# Patient Record
Sex: Female | Born: 1980 | Hispanic: No | Marital: Married | Smoking: Former smoker
Health system: Southern US, Community
[De-identification: ages and names within clinical notes are randomized; demographics above are authoritative.]

## PROBLEM LIST (undated history)

## (undated) ENCOUNTER — Inpatient Hospital Stay (HOSPITAL_COMMUNITY): Payer: Self-pay

## (undated) DIAGNOSIS — Z789 Other specified health status: Secondary | ICD-10-CM

## (undated) DIAGNOSIS — Z8619 Personal history of other infectious and parasitic diseases: Secondary | ICD-10-CM

## (undated) DIAGNOSIS — IMO0002 Reserved for concepts with insufficient information to code with codable children: Secondary | ICD-10-CM

## (undated) DIAGNOSIS — R51 Headache: Secondary | ICD-10-CM

## (undated) DIAGNOSIS — R87619 Unspecified abnormal cytological findings in specimens from cervix uteri: Secondary | ICD-10-CM

## (undated) DIAGNOSIS — O24419 Gestational diabetes mellitus in pregnancy, unspecified control: Secondary | ICD-10-CM

## (undated) HISTORY — DX: Headache: R51

## (undated) HISTORY — PX: ABDOMINAL HYSTERECTOMY: SHX81

## (undated) HISTORY — DX: Personal history of other infectious and parasitic diseases: Z86.19

## (undated) HISTORY — DX: Reserved for concepts with insufficient information to code with codable children: IMO0002

## (undated) HISTORY — PX: NO PAST SURGERIES: SHX2092

## (undated) HISTORY — DX: Unspecified abnormal cytological findings in specimens from cervix uteri: R87.619

---

## 2011-07-04 LAB — OB RESULTS CONSOLE GC/CHLAMYDIA: Chlamydia: NEGATIVE

## 2011-07-04 LAB — OB RESULTS CONSOLE ABO/RH: RH Type: POSITIVE

## 2011-07-04 LAB — OB RESULTS CONSOLE HEPATITIS B SURFACE ANTIGEN: Hepatitis B Surface Ag: NEGATIVE

## 2011-07-04 LAB — OB RESULTS CONSOLE HIV ANTIBODY (ROUTINE TESTING): HIV: NONREACTIVE

## 2011-07-04 LAB — OB RESULTS CONSOLE RPR: RPR: NONREACTIVE

## 2011-10-17 NOTE — L&D Delivery Note (Addendum)
Delivery Note At 3:35 AM a viable and healthy female was delivered via Vaginal, Vacuum assist (Presentation: Left Occiput Anterior).  APGAR: 8,9 ; weight 6 lbs 9 oz .   Placenta status: Intact, Spontaneous.  Cord: 3 vessels.  Anesthesia: Epidural  Lacerations: 2nd degree  Suture Repair: 3.0 chromic Est. Blood Loss (mL): 400  Mom to postpartum.  Baby to nursery-stable.  Kristie Clark D 02/10/2012, 4:07 AM

## 2011-12-13 ENCOUNTER — Encounter: Payer: BC Managed Care – PPO | Attending: Obstetrics and Gynecology | Admitting: *Deleted

## 2011-12-13 DIAGNOSIS — O99810 Abnormal glucose complicating pregnancy: Secondary | ICD-10-CM | POA: Insufficient documentation

## 2011-12-13 DIAGNOSIS — Z713 Dietary counseling and surveillance: Secondary | ICD-10-CM | POA: Insufficient documentation

## 2011-12-14 ENCOUNTER — Encounter: Payer: Self-pay | Admitting: *Deleted

## 2011-12-14 NOTE — Patient Instructions (Signed)
Goals:  Check glucose levels per MD as instructed  Follow Gestational Diabetes Diet as instructed  Call for follow-up as needed    

## 2011-12-14 NOTE — Progress Notes (Signed)
  Patient was seen on 12/13/2011 for Gestational Diabetes self-management class at the [facility]. The following learning objectives were met by the patient during this course:   States the definition of Gestational Diabetes  States why dietary management is important in controlling blood glucose  Describes the effects each nutrient has on blood glucose levels  Demonstrates ability to create a balanced meal plan  Demonstrates carbohydrate counting   States when to check blood glucose levels  Demonstrates proper blood glucose monitoring techniques  States the effect of stress and exercise on blood glucose levels  States the importance of limiting caffeine and abstaining from alcohol and smoking  Blood glucose monitor given:  One Touch Ultra Mini Self Monitoring Kit Lot # [id number] x Exp: 08/2012 Blood glucose reading: 121 mg/dl  Patient instructed to monitor glucose levels: FBS: 60 - <90 2 hour: <120  *Patient received handouts:  Nutrition Diabetes and Pregnancy  Carbohydrate Counting List  Patient will be seen for follow-up as needed.

## 2012-01-08 ENCOUNTER — Encounter (HOSPITAL_COMMUNITY): Payer: Self-pay | Admitting: *Deleted

## 2012-01-08 ENCOUNTER — Inpatient Hospital Stay (HOSPITAL_COMMUNITY)
Admission: AD | Admit: 2012-01-08 | Discharge: 2012-01-08 | Disposition: A | Discharge status: Home / Self Care | Payer: BC Managed Care – PPO | Source: Ambulatory Visit | Attending: Obstetrics & Gynecology | Admitting: Obstetrics & Gynecology

## 2012-01-08 DIAGNOSIS — O479 False labor, unspecified: Secondary | ICD-10-CM

## 2012-01-08 DIAGNOSIS — R109 Unspecified abdominal pain: Secondary | ICD-10-CM | POA: Insufficient documentation

## 2012-01-08 DIAGNOSIS — O4700 False labor before 37 completed weeks of gestation, unspecified trimester: Secondary | ICD-10-CM | POA: Insufficient documentation

## 2012-01-08 HISTORY — DX: Gestational diabetes mellitus in pregnancy, unspecified control: O24.419

## 2012-01-08 LAB — URINALYSIS, ROUTINE W REFLEX MICROSCOPIC
Glucose, UA: NEGATIVE mg/dL
Protein, ur: NEGATIVE mg/dL
Urobilinogen, UA: 0.2 mg/dL (ref 0.0–1.0)

## 2012-01-08 LAB — URINE MICROSCOPIC-ADD ON

## 2012-01-08 MED ORDER — SODIUM CHLORIDE 0.9 % IV SOLN
25.0000 mg | Freq: Once | INTRAVENOUS | Status: AC
  Administered 2012-01-08: 25 mg via INTRAVENOUS
  Filled 2012-01-08: qty 1

## 2012-01-08 MED ORDER — NIFEDIPINE 10 MG PO CAPS
10.0000 mg | ORAL_CAPSULE | Freq: Once | ORAL | Status: AC
  Administered 2012-01-08: 10 mg via ORAL
  Filled 2012-01-08: qty 1

## 2012-01-08 NOTE — MAU Provider Note (Signed)
  History     CSN: [id number]  Arrival date and time: 01/08/12 6295   First Provider Initiated Contact with Patient 01/08/12 [phone]      Chief Complaint  Patient presents with  . Abdominal Cramping   HPI This is a 31 y.o. at [redacted]w[redacted]d who presents with c/o contractions and nausea all night. No leaking or bleeding. Was feeling decreased fetal movement but baby is moving well now. Pregnancy has been remarkable for Gestational Diabetes and smoking.  She told RN she is not taking her Glyburide because she did some research and believes it will hurt the baby.   OB History    Grav Para Term Preterm Abortions TAB SAB Ect Mult Living   1               Past Medical History  Diagnosis Date  . Gestational diabetes     Past Surgical History  Procedure Date  . No past surgeries     History reviewed. No pertinent family history.  History  Substance Use Topics  . Smoking status: Former Games developer  . Smokeless tobacco: Not on file  . Alcohol Use: No    Allergies: No Known Allergies  Prescriptions prior to admission  Medication Sig Dispense Refill  . calcium carbonate (TUMS - DOSED IN MG ELEMENTAL CALCIUM) 500 MG chewable tablet Chew 1 tablet by mouth daily as needed. For heartburn      . guaiFENesin (MUCINEX) 600 MG 12 hr tablet Take 600 mg by mouth 2 (two) times daily. For congestion or cold symptoms      . Prenatal Vit-Fe Fumarate-FA (PRENATAL MULTIVITAMIN) TABS Take 1 tablet by mouth every morning.      . ranitidine (ZANTAC) 150 MG tablet Take 150 mg by mouth 2 (two) times daily as needed. For heartburn        Review of Systems  Constitutional: Negative for fever.  Gastrointestinal: Positive for nausea. Negative for vomiting.       Contractions    Physical Exam   Blood pressure 124/81, pulse 91, temperature 97.1 F (36.2 C), resp. rate 18, height 5' (1.524 m), weight 170 lb (77.111 kg).  Physical Exam  Constitutional: She is oriented to person, place, and time. She appears  well-developed and well-nourished. No distress.  HENT:  Head: Normocephalic.  Cardiovascular: Normal rate.   Respiratory: Effort normal.  GI: Soft. She exhibits no distension and no mass. There is no tenderness. There is no rebound and no guarding.  Genitourinary: Vagina normal and uterus normal. No vaginal discharge found.       Cervix closed/70%/-3/vtx FHR reactive UCs every 3-4 minutes  Musculoskeletal: Normal range of motion.  Neurological: She is alert and oriented to person, place, and time.  Skin: Skin is warm and dry.  Psychiatric: She has a normal mood and affect.    MAU Course  Procedures  MDM Consulted Dr Aldona Bar.  Will hydrate with IV fluids and give Phenergan for nausea. Will give Procardia for contractions.  Assessment and Plan  Got fluids and med and UCs have stopped. Now just has some irritability.  Will d/c home. Will reinforce importance of taking glyburide  [employer] [facility] 01/08/2012, 5:36 AM

## 2012-01-08 NOTE — Discharge Instructions (Signed)
Normal Labor and Delivery Your caregiver must first be sure you are in labor. Signs of labor include:  You may pass what is called "the mucus plug" before labor begins. This is a small amount of blood stained mucus.   Regular uterine contractions.   The time between contractions get closer together.   The discomfort and pain gradually gets more intense.   Pains are mostly located in the back.   Pains get worse when walking.   The cervix (the opening of the uterus becomes thinner (begins to efface) and opens up (dilates).  Once you are in labor and admitted into the hospital or care center, your caregiver will do the following:  A complete physical examination.   Check your vital signs (blood pressure, pulse, temperature and the fetal heart rate).   Do a vaginal examination (using a sterile glove and lubricant) to determine:   The position (presentation) of the baby (head [vertex] or buttock first).   The level (station) of the baby's head in the birth canal.   The effacement and dilatation of the cervix.   You may have your pubic hair shaved and be given an enema depending on your caregiver and the circumstance.   An electronic monitor is usually placed on your abdomen. The monitor follows the length and intensity of the contractions, as well as the baby's heart rate.   Usually, your caregiver will insert an IV in your arm with a bottle of sugar water. This is done as a precaution so that medications can be given to you quickly during labor or delivery.  NORMAL LABOR AND DELIVERY IS DIVIDED UP INTO 3 STAGES: First Stage This is when regular contractions begin and the cervix begins to efface and dilate. This stage can last from 3 to 15 hours. The end of the first stage is when the cervix is 100% effaced and 10 centimeters dilated. Pain medications may be given by   Injection (morphine, demerol, etc.)   Regional anesthesia (spinal, caudal or epidural, anesthetics given in  different locations of the spine). Paracervical pain medication may be given, which is an injection of and anesthetic on each side of the cervix.  A pregnant woman may request to have "Natural Childbirth" which is not to have any medications or anesthesia during her labor and delivery. Second Stage This is when the baby comes down through the birth canal (vagina) and is born. This can take 1 to 4 hours. As the baby's head comes down through the birth canal, you may feel like you are going to have a bowel movement. You will get the urge to bear down and push until the baby is delivered. As the baby's head is being delivered, the caregiver will decide if an episiotomy (a cut in the perineum and vagina area) is needed to prevent tearing of the tissue in this area. The episiotomy is sewn up after the delivery of the baby and placenta. Sometimes a mask with nitrous oxide is given for the mother to breath during the delivery of the baby to help if there is too much pain. The end of Stage 2 is when the baby is fully delivered. Then when the umbilical cord stops pulsating it is clamped and cut. Third Stage The third stage begins after the baby is completely delivered and ends after the placenta (afterbirth) is delivered. This usually takes 5 to 30 minutes. After the placenta is delivered, a medication is given either by intravenous or injection to help contract   the uterus and prevent bleeding. The third stage is not painful and pain medication is usually not necessary. If an episiotomy was done, it is repaired at this time. After the delivery, the mother is watched and monitored closely for 1 to 2 hours to make sure there is no postpartum bleeding (hemorrhage). If there is a lot of bleeding, medication is given to contract the uterus and stop the bleeding. Document Released: 07/11/2008 Document Revised: 09/21/2011 Document Reviewed: 07/11/2008 Exit[employer] Patient Information 2012 [employer]. 

## 2012-01-08 NOTE — MAU Note (Signed)
Pt reports she feels "cramping and nausea"

## 2012-01-08 NOTE — MAU Note (Signed)
M. Williams CNM at the bedside. 

## 2012-01-10 LAB — OB RESULTS CONSOLE GBS: GBS: NEGATIVE

## 2012-01-17 ENCOUNTER — Inpatient Hospital Stay (HOSPITAL_COMMUNITY)
Admission: AD | Admit: 2012-01-17 | Discharge: 2012-01-17 | Disposition: A | Discharge status: Home / Self Care | Payer: BC Managed Care – PPO | Source: Ambulatory Visit | Attending: Obstetrics and Gynecology | Admitting: Obstetrics and Gynecology

## 2012-01-17 ENCOUNTER — Encounter (HOSPITAL_COMMUNITY): Payer: Self-pay | Admitting: *Deleted

## 2012-01-17 DIAGNOSIS — O368190 Decreased fetal movements, unspecified trimester, not applicable or unspecified: Secondary | ICD-10-CM | POA: Insufficient documentation

## 2012-01-17 HISTORY — DX: Other specified health status: Z78.9

## 2012-01-17 NOTE — Discharge Instructions (Signed)
Return to MAU with any worsening symptoms.  Call you doctor with any concerns or questions.

## 2012-01-17 NOTE — MAU Note (Signed)
Pt presents with complaint of decreased fetal movement today but reports is baby is moving now. Also reports sporatic contractions today.

## 2012-01-17 NOTE — Progress Notes (Signed)
Dr. Tenny Craw notified of pt presenting for contractions and decreased fetal movement.  Notified of VE and ctx pattern with reactive fetal strip.  Ok to [employer] home.  Follow up with regular scheduled appointment.

## 2012-01-30 ENCOUNTER — Encounter (HOSPITAL_COMMUNITY): Payer: Self-pay | Admitting: *Deleted

## 2012-01-30 ENCOUNTER — Inpatient Hospital Stay (HOSPITAL_COMMUNITY)
Admission: AD | Admit: 2012-01-30 | Discharge: 2012-01-30 | Disposition: A | Discharge status: Home / Self Care | Payer: BC Managed Care – PPO | Source: Ambulatory Visit | Attending: Obstetrics & Gynecology | Admitting: Obstetrics & Gynecology

## 2012-01-30 DIAGNOSIS — O479 False labor, unspecified: Secondary | ICD-10-CM | POA: Insufficient documentation

## 2012-01-30 NOTE — MAU Note (Signed)
Gave pt option to walk and be rechecked or to dc home.  Pt wishes to dc home.

## 2012-01-30 NOTE — Discharge Instructions (Signed)
Return to MAU with any worsening symptoms.  Call your doctor with any concerns or questions.  Follow up with regular scheduled appointment tomorrow.

## 2012-01-30 NOTE — Progress Notes (Signed)
Dr. Arlyce Dice notified of pt presenting for labor check. Notified of ctx pattern.  Notified of VE.  Orders to give pt option to walk for an hour and recheck cervix or dc home now and return when ctx worsen.

## 2012-01-30 NOTE — MAU Note (Signed)
Contractions, denies bleeding or ROM 

## 2012-02-05 ENCOUNTER — Telehealth (HOSPITAL_COMMUNITY): Payer: Self-pay | Admitting: *Deleted

## 2012-02-05 ENCOUNTER — Encounter (HOSPITAL_COMMUNITY): Payer: Self-pay | Admitting: *Deleted

## 2012-02-05 NOTE — Telephone Encounter (Signed)
Preadmission screen  

## 2012-02-06 ENCOUNTER — Ambulatory Visit (INDEPENDENT_AMBULATORY_CARE_PROVIDER_SITE_OTHER): Payer: BC Managed Care – PPO | Admitting: Pediatrics

## 2012-02-06 DIAGNOSIS — Z7681 Expectant parent(s) prebirth pediatrician visit: Secondary | ICD-10-CM

## 2012-02-08 ENCOUNTER — Encounter (HOSPITAL_COMMUNITY): Payer: Self-pay

## 2012-02-08 ENCOUNTER — Inpatient Hospital Stay (HOSPITAL_COMMUNITY)
Admission: RE | Admit: 2012-02-08 | Discharge: 2012-02-12 | DRG: 372 | Disposition: A | Discharge status: Home / Self Care | Payer: BC Managed Care – PPO | Source: Ambulatory Visit | Attending: Obstetrics & Gynecology | Admitting: Obstetrics & Gynecology

## 2012-02-08 VITALS — BP 133/89 | HR 76 | Temp 98.1°F | Resp 20 | Ht 60.0 in | Wt 179.0 lb

## 2012-02-08 DIAGNOSIS — O99814 Abnormal glucose complicating childbirth: Principal | ICD-10-CM | POA: Diagnosis present

## 2012-02-08 DIAGNOSIS — Z3480 Encounter for supervision of other normal pregnancy, unspecified trimester: Secondary | ICD-10-CM

## 2012-02-08 LAB — COMPREHENSIVE METABOLIC PANEL
ALT: 41 U/L — ABNORMAL HIGH (ref 0–35)
AST: 38 U/L — ABNORMAL HIGH (ref 0–37)
Albumin: 2.8 g/dL — ABNORMAL LOW (ref 3.5–5.2)
CO2: 25 mEq/L (ref 19–32)
Calcium: 9.9 mg/dL (ref 8.4–10.5)
Chloride: 97 mEq/L (ref 96–112)
GFR calc non Af Amer: >90 mL/min (ref >90)
Sodium: 137 mEq/L (ref 135–145)

## 2012-02-08 LAB — CBC
HCT: 36.6 % (ref 36.0–46.0)
Hemoglobin: 12.6 g/dL (ref 12.0–15.0)
WBC: 8.1 10*3/uL (ref 4.0–10.5)

## 2012-02-08 LAB — GLUCOSE, CAPILLARY: Glucose-Capillary: 95 mg/dL (ref 70–99)

## 2012-02-08 MED ORDER — OXYTOCIN BOLUS FROM INFUSION
500.0000 mL | Freq: Once | INTRAVENOUS | Status: DC
  Filled 2012-02-08: qty 500

## 2012-02-08 MED ORDER — LACTATED RINGERS IV SOLN
INTRAVENOUS | Status: DC
  Administered 2012-02-08: 20:00:00 via INTRAVENOUS

## 2012-02-08 MED ORDER — OXYTOCIN 20 UNITS IN LACTATED RINGERS INFUSION - SIMPLE
125.0000 mL/h | Freq: Once | INTRAVENOUS | Status: DC
  Filled 2012-02-08: qty 1000

## 2012-02-08 MED ORDER — ZOLPIDEM TARTRATE 10 MG PO TABS: 10.0000 mg | ORAL_TABLET | Freq: Every evening | ORAL | Status: DC | PRN

## 2012-02-08 MED ORDER — LIDOCAINE HCL (PF) 1 % IJ SOLN
30.0000 mL | INTRAMUSCULAR | Status: DC | PRN
  Filled 2012-02-08: qty 30

## 2012-02-08 MED ORDER — DIPHENHYDRAMINE HCL 25 MG PO CAPS
25.0000 mg | ORAL_CAPSULE | ORAL | Status: DC | PRN
  Administered 2012-02-08: 25 mg via ORAL
  Filled 2012-02-08: qty 1

## 2012-02-08 MED ORDER — OXYTOCIN 20 UNITS IN LACTATED RINGERS INFUSION - SIMPLE
1.0000 m[IU]/min | INTRAVENOUS | Status: DC
  Filled 2012-02-08: qty 1000

## 2012-02-08 MED ORDER — KCL-LACTATED RINGERS 20 MEQ/L IV SOLN
INTRAVENOUS | Status: DC
  Administered 2012-02-08 – 2012-02-09 (×4): via INTRAVENOUS
  Filled 2012-02-08 (×8): qty 1000

## 2012-02-08 MED ORDER — FLEET ENEMA 7-19 GM/118ML RE ENEM: 1.0000 | ENEMA | RECTAL | Status: DC | PRN

## 2012-02-08 MED ORDER — LACTATED RINGERS IV SOLN
500.0000 mL | INTRAVENOUS | Status: DC | PRN
  Administered 2012-02-10: 500 mL via INTRAVENOUS

## 2012-02-08 MED ORDER — ONDANSETRON HCL 4 MG/2ML IJ SOLN: 4.0000 mg | Freq: Four times a day (QID) | INTRAMUSCULAR | Status: DC | PRN

## 2012-02-08 MED ORDER — MISOPROSTOL 25 MCG QUARTER TABLET
25.0000 ug | ORAL_TABLET | ORAL | Status: DC | PRN
  Administered 2012-02-08 – 2012-02-09 (×2): 25 ug via VAGINAL
  Filled 2012-02-08 (×2): qty 0.25

## 2012-02-08 MED ORDER — CITRIC ACID-SODIUM CITRATE 334-500 MG/5ML PO SOLN: 30.0000 mL | ORAL | Status: DC | PRN

## 2012-02-08 MED ORDER — BUTORPHANOL TARTRATE 2 MG/ML IJ SOLN
1.0000 mg | INTRAMUSCULAR | Status: DC | PRN
  Administered 2012-02-09 (×3): 1 mg via INTRAVENOUS
  Filled 2012-02-08 (×3): qty 1

## 2012-02-08 MED ORDER — TERBUTALINE SULFATE 1 MG/ML IJ SOLN: 0.2500 mg | Freq: Once | INTRAMUSCULAR | Status: AC | PRN

## 2012-02-08 MED ORDER — OXYCODONE-ACETAMINOPHEN 5-325 MG PO TABS: 1.0000 | ORAL_TABLET | ORAL | Status: DC | PRN

## 2012-02-08 MED ORDER — ACETAMINOPHEN 325 MG PO TABS
650.0000 mg | ORAL_TABLET | ORAL | Status: DC | PRN
  Administered 2012-02-09: 650 mg via ORAL
  Filled 2012-02-08: qty 2

## 2012-02-08 MED ORDER — IBUPROFEN 600 MG PO TABS: 600.0000 mg | ORAL_TABLET | Freq: Four times a day (QID) | ORAL | Status: DC | PRN

## 2012-02-08 NOTE — H&P (Signed)
31 year old G1P0 admitted for scheduled induction at 39 weeks and 1 day (EDC 5/1) because of medication requiring Gestational Diabetes.  Has been on glyburide 5mg . at bedtime and sugars have been good.  -GBS.  Has gained 50 pounds during pregnancy; also was a smoker until [redacted] weeks gestation.  An U/S on 4/21 suggested an EFW at 34% (2740 gm) with an AFI of 16.  PE:  VS stable Abd:  S+ 35, FH reassuring. Cx:  FT/50/vtx -2 Ext:  Brawny edema both LE to thigh  IMP:  39 weeks and 1 day IUP with medication requiring Gestational Diabetes Plan:  Induction of labor -- cytotec, etc.

## 2012-02-08 NOTE — Progress Notes (Signed)
MD at bedside. POC discussed with patient. Questions answered. Will continue to monitor.

## 2012-02-08 NOTE — Progress Notes (Signed)
MD made aware of pt's blood pressures and request for medication for head cold/allegies. New orders given. Will continue to monitor.

## 2012-02-08 NOTE — Progress Notes (Signed)
MD made aware of critical low potassium of 2.7 with all CMP results. New orders given. Will continue to monitor.

## 2012-02-09 ENCOUNTER — Inpatient Hospital Stay (HOSPITAL_COMMUNITY): Payer: BC Managed Care – PPO | Admitting: Anesthesiology

## 2012-02-09 ENCOUNTER — Encounter (HOSPITAL_COMMUNITY): Payer: Self-pay | Admitting: Anesthesiology

## 2012-02-09 LAB — COMPREHENSIVE METABOLIC PANEL
ALT: 35 U/L (ref 0–35)
AST: 32 U/L (ref 0–37)
CO2: 24 mEq/L (ref 19–32)
Calcium: 8.4 mg/dL (ref 8.4–10.5)
Creatinine, Ser: 0.38 mg/dL — ABNORMAL LOW (ref 0.50–1.10)
GFR calc Af Amer: >90 mL/min (ref >90)
GFR calc non Af Amer: >90 mL/min (ref >90)
Glucose, Bld: 87 mg/dL (ref 70–99)
Sodium: 136 mEq/L (ref 135–145)
Total Protein: 5.5 g/dL — ABNORMAL LOW (ref 6.0–8.3)

## 2012-02-09 LAB — GLUCOSE, CAPILLARY
Glucose-Capillary: 66 mg/dL — ABNORMAL LOW (ref 70–99)
Glucose-Capillary: 67 mg/dL — ABNORMAL LOW (ref 70–99)

## 2012-02-09 LAB — CBC
MCH: 30.5 pg (ref 26.0–34.0)
MCHC: 34.3 g/dL (ref 30.0–36.0)
MCV: 88.9 fL (ref 78.0–100.0)
Platelets: 155 10*3/uL (ref 150–400)
RBC: 4.06 MIL/uL (ref 3.87–5.11)

## 2012-02-09 LAB — RPR: RPR Ser Ql: NONREACTIVE

## 2012-02-09 MED ORDER — LACTATED RINGERS IV SOLN: 500.0000 mL | Freq: Once | INTRAVENOUS | Status: DC

## 2012-02-09 MED ORDER — FENTANYL 2.5 MCG/ML BUPIVACAINE 1/10 % EPIDURAL INFUSION (WH - ANES)
INTRAMUSCULAR | Status: DC | PRN
  Administered 2012-02-09: 13 mL/h via EPIDURAL

## 2012-02-09 MED ORDER — EPHEDRINE 5 MG/ML INJ: 10.0000 mg | INTRAVENOUS | Status: DC | PRN

## 2012-02-09 MED ORDER — PHENYLEPHRINE 40 MCG/ML (10ML) SYRINGE FOR IV PUSH (FOR BLOOD PRESSURE SUPPORT): 80.0000 ug | PREFILLED_SYRINGE | INTRAVENOUS | Status: DC | PRN

## 2012-02-09 MED ORDER — LIDOCAINE HCL (PF) 1 % IJ SOLN
INTRAMUSCULAR | Status: DC | PRN
  Administered 2012-02-09 (×2): 4 mL

## 2012-02-09 MED ORDER — EPHEDRINE 5 MG/ML INJ
10.0000 mg | INTRAVENOUS | Status: DC | PRN
  Filled 2012-02-09: qty 4

## 2012-02-09 MED ORDER — MENTHOL 3 MG MT LOZG
1.0000 | LOZENGE | OROMUCOSAL | Status: DC | PRN
  Administered 2012-02-09: 3 mg via ORAL
  Filled 2012-02-09: qty 9

## 2012-02-09 MED ORDER — OXYTOCIN 20 UNITS IN LACTATED RINGERS INFUSION - SIMPLE
1.0000 m[IU]/min | INTRAVENOUS | Status: DC
  Administered 2012-02-09: 4 m[IU]/min via INTRAVENOUS
  Administered 2012-02-09: 2 m[IU]/min via INTRAVENOUS

## 2012-02-09 MED ORDER — FENTANYL 2.5 MCG/ML BUPIVACAINE 1/10 % EPIDURAL INFUSION (WH - ANES)
14.0000 mL/h | INTRAMUSCULAR | Status: DC
  Administered 2012-02-09 – 2012-02-10 (×3): 14 mL/h via EPIDURAL
  Filled 2012-02-09 (×4): qty 60

## 2012-02-09 MED ORDER — PHENYLEPHRINE 40 MCG/ML (10ML) SYRINGE FOR IV PUSH (FOR BLOOD PRESSURE SUPPORT)
80.0000 ug | PREFILLED_SYRINGE | INTRAVENOUS | Status: DC | PRN
  Filled 2012-02-09: qty 5

## 2012-02-09 MED ORDER — DIPHENHYDRAMINE HCL 50 MG/ML IJ SOLN: 12.5000 mg | INTRAMUSCULAR | Status: DC | PRN

## 2012-02-09 NOTE — Progress Notes (Signed)
Shanda Bumps, RN took over pt care from [place], [place]

## 2012-02-09 NOTE — Progress Notes (Signed)
Pt coughing and moving during BP reading--will continue to assess

## 2012-02-09 NOTE — Progress Notes (Signed)
Cervix 1/60. Vtx -2.  Regular uterine contractions.  Pt. Has received 2 doses of IV Stadol for pain relief.  AROM clear fluid. IUPC placed.  Will continue IV pitocin induction.

## 2012-02-09 NOTE — Anesthesia Procedure Notes (Signed)
Epidural Patient location during procedure: OB Start time: 02/09/2012 12:07 PM  Staffing Anesthesiologist: Dimas Scheck A. Performed by: anesthesiologist   Preanesthetic Checklist Completed: patient identified, site marked, surgical consent, pre-op evaluation, timeout performed, IV checked, risks and benefits discussed and monitors and equipment checked  Epidural Patient position: sitting Prep: site prepped and draped and DuraPrep Patient monitoring: continuous pulse ox and blood pressure Approach: midline Injection technique: LOR air  Needle:  Needle type: Tuohy  Needle gauge: 17 G Needle length: 9 cm Needle insertion depth: 5 cm cm Catheter type: closed end flexible Catheter size: 19 Gauge Catheter at skin depth: 10 cm Test dose: negative and Other  Assessment Events: blood not aspirated, injection not painful, no injection resistance, negative IV test and no paresthesia  Additional Notes Patient identified. Risks and benefits discussed including failed block, incomplete  Pain control, post dural puncture headache, nerve damage, paralysis, blood pressure Changes, nausea, vomiting, reactions to medications-both toxic and allergic and post Partum back pain. All questions were answered. Patient expressed understanding and wished to proceed. Sterile technique was used throughout procedure. Epidural site was Dressed with sterile barrier dressing. No paresthesias, signs of intravascular injection Or signs of intrathecal spread were encountered.  Patient was more comfortable after the epidural was dosed. Please see RN's note for documentation of vital signs and FHR which are stable.

## 2012-02-09 NOTE — Anesthesia Preprocedure Evaluation (Signed)
Anesthesia Evaluation  Patient identified by MRN, date of birth, ID band Patient awake    Reviewed: Allergy & Precautions, H&P , Patient's Chart, lab work & pertinent test results  Airway Mallampati: III TM Distance: <3 FB Neck ROM: Full    Dental No notable dental hx. (+) Teeth Intact   Pulmonary neg pulmonary ROS,  breath sounds clear to auscultation  Pulmonary exam normal       Cardiovascular negative cardio ROS  Rhythm:Regular Rate:Normal     Neuro/Psych  Headaches, negative psych ROS   GI/Hepatic negative GI ROS, Neg liver ROS,   Endo/Other  Diabetes mellitus-, Well Controlled, Gestational  Renal/GU negative Renal ROS  negative genitourinary   Musculoskeletal negative musculoskeletal ROS (+)   Abdominal   Peds  Hematology negative hematology ROS (+)   Anesthesia Other Findings   Reproductive/Obstetrics (+) Pregnancy                           Anesthesia Physical Anesthesia Plan  ASA: II  Anesthesia Plan: Epidural   Post-op Pain Management:    Induction:   Airway Management Planned:   Additional Equipment:   Intra-op Plan:   Post-operative Plan:   Informed Consent: I have reviewed the patients History and Physical, chart, labs and discussed the procedure including the risks, benefits and alternatives for the proposed anesthesia with the patient or authorized representative who has indicated his/her understanding and acceptance.     Plan Discussed with: CRNA, Anesthesiologist and Surgeon  Anesthesia Plan Comments:         Anesthesia Quick Evaluation

## 2012-02-09 NOTE — Progress Notes (Signed)
BP 140/98 -- uncomfortable with contractions.  LFTs sl elevated last PM.  CBC and CMP to be repeated now.  Early preecampsia?

## 2012-02-10 ENCOUNTER — Encounter (HOSPITAL_COMMUNITY): Payer: Self-pay

## 2012-02-10 LAB — GLUCOSE, CAPILLARY: Glucose-Capillary: 98 mg/dL (ref 70–99)

## 2012-02-10 MED ORDER — WITCH HAZEL-GLYCERIN EX PADS: 1.0000 "application " | MEDICATED_PAD | CUTANEOUS | Status: DC | PRN

## 2012-02-10 MED ORDER — DIBUCAINE 1 % RE OINT: 1.0000 "application " | TOPICAL_OINTMENT | RECTAL | Status: DC | PRN

## 2012-02-10 MED ORDER — BENZOCAINE-MENTHOL 20-0.5 % EX AERO
1.0000 "application " | INHALATION_SPRAY | CUTANEOUS | Status: DC | PRN
  Filled 2012-02-10: qty 56

## 2012-02-10 MED ORDER — SIMETHICONE 80 MG PO CHEW: 80.0000 mg | CHEWABLE_TABLET | ORAL | Status: DC | PRN

## 2012-02-10 MED ORDER — PRENATAL MULTIVITAMIN CH
1.0000 | ORAL_TABLET | Freq: Every day | ORAL | Status: DC
  Administered 2012-02-10 – 2012-02-11 (×2): 1 via ORAL
  Filled 2012-02-10 (×3): qty 1

## 2012-02-10 MED ORDER — ZOLPIDEM TARTRATE 5 MG PO TABS: 5.0000 mg | ORAL_TABLET | Freq: Every evening | ORAL | Status: DC | PRN

## 2012-02-10 MED ORDER — IBUPROFEN 600 MG PO TABS
600.0000 mg | ORAL_TABLET | Freq: Four times a day (QID) | ORAL | Status: DC
  Administered 2012-02-10 – 2012-02-12 (×9): 600 mg via ORAL
  Filled 2012-02-10 (×9): qty 1

## 2012-02-10 MED ORDER — ONDANSETRON HCL 4 MG PO TABS: 4.0000 mg | ORAL_TABLET | ORAL | Status: DC | PRN

## 2012-02-10 MED ORDER — ONDANSETRON HCL 4 MG/2ML IJ SOLN: 4.0000 mg | INTRAMUSCULAR | Status: DC | PRN

## 2012-02-10 MED ORDER — LANOLIN HYDROUS EX OINT: TOPICAL_OINTMENT | CUTANEOUS | Status: DC | PRN

## 2012-02-10 MED ORDER — TETANUS-DIPHTH-ACELL PERTUSSIS 5-2.5-18.5 LF-MCG/0.5 IM SUSP
0.5000 mL | Freq: Once | INTRAMUSCULAR | Status: AC
  Administered 2012-02-12: 0.5 mL via INTRAMUSCULAR
  Filled 2012-02-10: qty 0.5

## 2012-02-10 MED ORDER — SENNOSIDES-DOCUSATE SODIUM 8.6-50 MG PO TABS
2.0000 | ORAL_TABLET | Freq: Every day | ORAL | Status: DC
  Administered 2012-02-11: 2 via ORAL

## 2012-02-10 MED ORDER — OXYCODONE-ACETAMINOPHEN 5-325 MG PO TABS
1.0000 | ORAL_TABLET | ORAL | Status: DC | PRN
  Administered 2012-02-10 – 2012-02-12 (×2): 1 via ORAL
  Filled 2012-02-10 (×2): qty 1

## 2012-02-10 MED ORDER — DIPHENHYDRAMINE HCL 25 MG PO CAPS: 25.0000 mg | ORAL_CAPSULE | Freq: Four times a day (QID) | ORAL | Status: DC | PRN

## 2012-02-10 NOTE — Anesthesia Postprocedure Evaluation (Signed)
  Anesthesia Post-op Note  Patient: Kristie Clark  Procedure(s) Performed: * No procedures listed *  Patient Location: Mother/Baby  Anesthesia Type: Epidural  Level of Consciousness: awake, alert  and oriented  Airway and Oxygen Therapy: Patient Spontanous Breathing  Post-op Pain: none  Post-op Assessment: Post-op Vital signs reviewed and Patient's Cardiovascular Status Stable  Post-op Vital Signs: Reviewed and stable  Complications: No apparent anesthesia complications

## 2012-02-11 LAB — CBC
HCT: 30.9 % — ABNORMAL LOW (ref 36.0–46.0)
Hemoglobin: 10.3 g/dL — ABNORMAL LOW (ref 12.0–15.0)
MCH: 30 pg (ref 26.0–34.0)
MCHC: 33.3 g/dL (ref 30.0–36.0)
MCV: 90.1 fL (ref 78.0–100.0)
Platelets: 151 10*3/uL (ref 150–400)
RBC: 3.43 MIL/uL — ABNORMAL LOW (ref 3.87–5.11)
RDW: 14.8 % (ref 11.5–15.5)
WBC: 13.8 10*3/uL — ABNORMAL HIGH (ref 4.0–10.5)

## 2012-02-11 LAB — BASIC METABOLIC PANEL
BUN: 3 mg/dL — ABNORMAL LOW (ref 6–23)
Chloride: 102 mEq/L (ref 96–112)
GFR calc Af Amer: >90 mL/min (ref >90)
GFR calc non Af Amer: >90 mL/min (ref >90)
Glucose, Bld: 128 mg/dL — ABNORMAL HIGH (ref 70–99)
Potassium: 3 mEq/L — ABNORMAL LOW (ref 3.5–5.1)
Sodium: 140 mEq/L (ref 135–145)

## 2012-02-11 MED ORDER — PSEUDOEPHEDRINE HCL 30 MG PO TABS
30.0000 mg | ORAL_TABLET | ORAL | Status: DC | PRN
  Administered 2012-02-11: 30 mg via ORAL
  Filled 2012-02-11: qty 1

## 2012-02-11 MED ORDER — DEXTROMETHORPHAN POLISTIREX 30 MG/5ML PO LQCR
15.0000 mg | Freq: Two times a day (BID) | ORAL | Status: DC
  Administered 2012-02-11 (×2): 15 mg via ORAL
  Filled 2012-02-11 (×6): qty 5

## 2012-02-11 NOTE — Progress Notes (Addendum)
Post Partum Day 1 Subjective: no complaints, complains of cough and nasal drainage.  Objective: Blood pressure 123/87, pulse 92, temperature 98.6 F . Pumping breast milk and bottle feeding baby. Repeat potassium is 3.0 mEq/L.  Random glucose 128 mg/dl.  Physical Exam:  General: alert Lochia: appropriate Uterine Fundus: firm   Basename 02/11/12 0525 02/09/12 0650  HGB 10.3* 12.4  HCT 30.9* 36.1    Assessment/Plan: Discussed discharge home today as an option.  Circumcision prior to discharge. Sudafed and dextromethorphan for nasal symptoms.    LOS: 3 days   Mickel Baas 02/11/2012, 10:37 AM

## 2012-02-12 NOTE — Discharge Summary (Signed)
Discharge diagnoses-  #1-39 week intrauterine pregnancy delivered 6 lbs. 9 oz. Female infant Apgars 8 and 9   #2-blood type O-positive  #3-medication requiring gestational diabetes  #4-induction of labor  #5-excess weight gain in pregnancy  Procedures-induction of labor and subsequent normal spontaneous delivery of 6 lbs. 9 oz. Female infant Apgars 8 and 9 and repair of second-degree tear  Summary-this 31 year old gravida 1 now para 1 was admitted at [redacted] weeks gestation for induction of labor because of medication requiring gestational diabetes. She progressed and ultimately delivered on the early morning of 4/27 that a 6 lbs. 9 oz. Female infant with Apgars of 8 and 9 over second-degree tear which was repaired without difficulty. Her postpartum course was relatively benign. Her baby was circumcised after delivery. She was breast-feeding and pumping at the time of discharge and doing well. Her discharge hemoglobin which was done on 4/28 with 10.3 with a white count of 13,800 and a platelet count of 151,000. On the morning of 4/29 she was ready for discharge and was given all appropriate instructions and understood all instructions well. Medications at time of discharge include vitamins-1 a day as long as she is breast-feeding, Feosol capsules with equivalent thereof one 3-4 times a week, and she was given prescriptions for Motrin 600 mg to use every 6 hours as needed for cramping or mild pain and Percocet 5/325 to use 1-2 every 4-6 hours as needed for more severe pain. She will return the office for followup in approximately 4 weeks' time or as needed.

## 2012-06-15 ENCOUNTER — Ambulatory Visit (INDEPENDENT_AMBULATORY_CARE_PROVIDER_SITE_OTHER): Payer: BC Managed Care – PPO | Admitting: Family Medicine

## 2012-06-15 VITALS — BP 103/69 | HR 70 | Temp 98.0°F | Resp 16 | Ht 63.5 in | Wt 126.8 lb

## 2012-06-15 DIAGNOSIS — T192XXA Foreign body in vulva and vagina, initial encounter: Secondary | ICD-10-CM

## 2012-06-15 NOTE — Progress Notes (Signed)
Patient thought she has a retained tampon  Objective:  NAD Vaginal exam:  Menstruating, entire vagina inspected and then a bimanual exam was done revealing no foreign body  Assessment:  Missing tampon, no foreign body  Plan:  reassured

## 2014-02-11 ENCOUNTER — Ambulatory Visit: Payer: BC Managed Care – PPO | Admitting: Family Medicine

## 2014-02-11 VITALS — BP 104/64 | HR 77 | Temp 98.3°F | Resp 16 | Ht 60.0 in | Wt 119.0 lb

## 2014-02-11 DIAGNOSIS — L02439 Carbuncle of limb, unspecified: Secondary | ICD-10-CM

## 2014-02-11 DIAGNOSIS — L02431 Carbuncle of right axilla: Secondary | ICD-10-CM

## 2014-02-11 DIAGNOSIS — M79609 Pain in unspecified limb: Secondary | ICD-10-CM

## 2014-02-11 DIAGNOSIS — M79621 Pain in right upper arm: Secondary | ICD-10-CM

## 2014-02-11 DIAGNOSIS — L02429 Furuncle of limb, unspecified: Secondary | ICD-10-CM

## 2014-02-11 MED ORDER — DOXYCYCLINE HYCLATE 100 MG PO CAPS: 100.0000 mg | ORAL_CAPSULE | Freq: Two times a day (BID) | ORAL | Status: DC

## 2014-02-11 NOTE — Patient Instructions (Signed)
Abscess An abscess is an infected area that contains a collection of pus and debris.It can occur in almost any part of the body. An abscess is also known as a furuncle or boil. CAUSES  An abscess occurs when tissue gets infected. This can occur from blockage of oil or sweat glands, infection of hair follicles, or a minor injury to the skin. As the body tries to fight the infection, pus collects in the area and creates pressure under the skin. This pressure causes pain. People with weakened immune systems have difficulty fighting infections and get certain abscesses more often.  SYMPTOMS Usually an abscess develops on the skin and becomes a painful mass that is red, warm, and tender. If the abscess forms under the skin, you may feel a moveable soft area under the skin. Some abscesses break open (rupture) on their own, but most will continue to get worse without care. The infection can spread deeper into the body and eventually into the bloodstream, causing you to feel ill.  DIAGNOSIS  Your caregiver will take your medical history and perform a physical exam. A sample of fluid may also be taken from the abscess to determine what is causing your infection. TREATMENT  Your caregiver may prescribe antibiotic medicines to fight the infection. However, taking antibiotics alone usually does not cure an abscess. Your caregiver may need to make a small cut (incision) in the abscess to drain the pus. In some cases, gauze is packed into the abscess to reduce pain and to continue draining the area. HOME CARE INSTRUCTIONS   Only take over-the-counter or prescription medicines for pain, discomfort, or fever as directed by your caregiver.  If you were prescribed antibiotics, take them as directed. Finish them even if you start to feel better.  If gauze is used, follow your caregiver's directions for changing the gauze.  To avoid spreading the infection:  Keep your draining abscess covered with a  bandage.  Wash your hands well.  Do not share personal care items, towels, or whirlpools with others.  Avoid skin contact with others.  Keep your skin and clothes clean around the abscess.  Keep all follow-up appointments as directed by your caregiver. SEEK MEDICAL CARE IF:   You have increased pain, swelling, redness, fluid drainage, or bleeding.  You have muscle aches, chills, or a general ill feeling.  You have a fever. MAKE SURE YOU:   Understand these instructions.  Will watch your condition.  Will get help right away if you are not doing well or get worse. Document Released: 07/12/2005 Document Revised: 04/02/2012 Document Reviewed: 12/15/2011 [employer] Patient Information 2014 [employer].  

## 2014-02-11 NOTE — Progress Notes (Signed)
Subjective:    Patient ID: Kristie Clark, female    DOB: 01/08/1981, 33 y.o.   MRN: [id number]  HPI This chart was scribed for Kristi Smith-MD, by Ladona Ridgelaylor Allexus Ovens, Scribe. This patient was seen in room 5 and the patient's care was started at 8:24 PM.  HPI Comments: Kristie Clark is a 33 y.o. female who presents to the [facility] for a constant, gradually worsened small and mild abscess to her right axilla, ongoing since x5 days ago. She denies any chills/sweats, Itching or drainage. She denies any recent changes of her soap or deodorant. She has not tried any medicines at home for this problem.  She denies any health problems. She has no PCP  Past Medical History  Diagnosis Date  . No pertinent past medical history   . History of chicken pox   . Abnormal Pap smear   . Headache(784.0)   . Gestational diabetes     on glyburide    No Known Allergies History   Social History  . Marital Status: Married    Spouse Name: N/A    Number of Children: N/A  . Years of Education: N/A   Occupational History  . Not on file.   Social History Main Topics  . Smoking status: Former Smoker    Quit date: 09/07/2011  . Smokeless tobacco: Never Used  . Alcohol Use: No  . Drug Use: No  . Sexual Activity: Yes    Birth Control/ Protection: Pill   Other Topics Concern  . Not on file   Social History Narrative  . No narrative on file     Review of Systems  Constitutional: Negative for fever and chills.  Respiratory: Negative for cough and shortness of breath.   Cardiovascular: Negative for chest pain.  Gastrointestinal: Negative for abdominal pain.  Musculoskeletal: Negative for back pain.  Skin:       Small abscess right axilla      Objective:   Physical Exam  Nursing note and vitals reviewed. Constitutional: She is oriented to person, place, and time. She appears well-developed and well-nourished. No distress.  HENT:  Head: Normocephalic and atraumatic.  Eyes:  Conjunctivae are normal. Pupils are equal, round, and reactive to light. Right eye exhibits no discharge. Left eye exhibits no discharge.  Neck: Normal range of motion.  Pulmonary/Chest: Effort normal. No respiratory distress.  Musculoskeletal: Normal range of motion. She exhibits no edema.  Neurological: She is alert and oriented to person, place, and time.  Skin: Skin is warm and dry. She is not diaphoretic.  In her right axilla she has a 8 mm diameter area of erythema, induration w/mild fluctuance which was tender to palpation.   Psychiatric: She has a normal mood and affect. Thought content normal.   Triage Vitals: BP 104/64  Pulse 77  Temp(Src) 98.3 F (36.8 C) (Oral)  Resp 16  Ht 5' (1.524 m)  Wt 119 lb (53.978 kg)  BMI 23.24 kg/m2  SpO2 100%  LMP 01/28/2014     Assessment & Plan:  Pain in right axilla  Carbuncle of right axilla  1. Pain R axilla:  New. Secondary to carbuncle; recommend Ibuprofen PRN. 2.  Carbuncle R axilla:  New.  Rx for Doxycycline provided.  Warm compresses to area three times daily for 15-20 minutes.  RTC for increasing redness, swelling, pain.  Meds ordered this encounter  Medications  . doxycycline (VIBRAMYCIN) 100 MG capsule    Sig: Take 1 capsule (100 mg total)  by mouth 2 (two) times daily.    Dispense:  20 capsule    Refill:  0     I personally performed the services described in this documentation, which was scribed in my presence. The recorded information has been reviewed and is accurate.   Nilda SimmerKristi Smith, M.D.  [facility]  [place] [address] [place], [place]  [id number] [phone] phone [phone] fax

## 2016-07-18 ENCOUNTER — Ambulatory Visit (INDEPENDENT_AMBULATORY_CARE_PROVIDER_SITE_OTHER): Payer: BLUE CROSS/BLUE SHIELD | Admitting: Family Medicine

## 2016-07-18 VITALS — BP 118/80 | HR 92 | Temp 99.3°F | Resp 16 | Ht 61.0 in | Wt 116.0 lb

## 2016-07-18 DIAGNOSIS — B370 Candidal stomatitis: Secondary | ICD-10-CM | POA: Insufficient documentation

## 2016-07-18 DIAGNOSIS — J301 Allergic rhinitis due to pollen: Secondary | ICD-10-CM | POA: Diagnosis not present

## 2016-07-18 MED ORDER — NYSTATIN 100000 UNIT/ML MT SUSP: 5.0000 mL | Freq: Four times a day (QID) | OROMUCOSAL | 0 refills | Status: DC

## 2016-07-18 NOTE — Assessment & Plan Note (Signed)
Possible beginnings of thrush, but is having symptoms.  Does have a history of recent antibiotic use.  Will treat if becomes more symptomatic.  Follow up as needed.

## 2016-07-18 NOTE — Progress Notes (Signed)
   Subjective:    Patient ID: Kristie Clark, female    DOB: [birth year], 35 y.o.   MRN: [id number]  HPI Presents with 8 days of throat and mouth burning.  Noticed white coating on her tongue the other day.  Denies inhaled steroids, recent steroids, did recently take antibiotics though.  No chronic medical issues except allergies.  Denies smoking, chewing tobacco, alcohol use.  Does have a sweet tooth.  She is taking flonase.    Review of Systems  Constitutional: Negative for chills, fatigue and fever.  HENT: Positive for sore throat. Negative for congestion, rhinorrhea and voice change.   Respiratory: Negative for cough, chest tightness and shortness of breath.   Cardiovascular: Negative for chest pain and leg swelling.  Gastrointestinal: Negative for abdominal pain and nausea.  Genitourinary: Negative for dysuria and urgency.  Musculoskeletal: Negative for arthralgias and joint swelling.  Skin: Negative for rash and wound.  Psychiatric/Behavioral: Negative for agitation and confusion.  All other systems reviewed and are negative.      Objective:   Physical Exam  Constitutional: She is oriented to person, place, and time. She appears well-developed and well-nourished. No distress.  HENT:  Head: Normocephalic and atraumatic.  Right Ear: External ear normal.  Left Ear: External ear normal.  Mouth/Throat: Oropharynx is clear and moist. No oropharyngeal exudate.  Has white coating on tongue, no other white spots on buccal mucosa, pharynx, roof of mouth. No erythema present. Nares with pale, boggy muscoa and clear discharge.  Neck: Normal range of motion. Neck supple.  Cardiovascular: Normal rate, regular rhythm, normal heart sounds and intact distal pulses.  Exam reveals no gallop and no friction rub.   No murmur heard. Pulmonary/Chest: Effort normal and breath sounds normal. No respiratory distress. She has no wheezes. She has no rales. She exhibits no tenderness.  Musculoskeletal:  Normal range of motion. She exhibits no edema.  Lymphadenopathy:    She has no cervical adenopathy.  Neurological: She is alert and oriented to person, place, and time.  Skin: Skin is warm. No rash noted. She is not diaphoretic. No erythema.  Psychiatric: She has a normal mood and affect. Her behavior is normal. Judgment and thought content normal.  Nursing note and vitals reviewed.  BP 118/80 (BP Location: Right Arm, Patient Position: Sitting, Cuff Size: Normal)   Pulse 92   Temp 99.3 F (37.4 C) (Oral)   Resp 16   Ht 5\' 1"  (1.549 m)   Wt 116 lb (52.6 kg)   LMP 01/28/2014   SpO2 98%   BMI 21.92 kg/m         Assessment & Plan:  Thrush Possible beginnings of thrush, but is having symptoms.  Does have a history of recent antibiotic use.  Will treat if becomes more symptomatic.  Follow up as needed.  Acute seasonal allergic rhinitis due to pollen She is taking flonase, can continue this daily.  Can take claritin if not resolved with this.   Signed,  Corliss MarcusAlicia Barnes, DO [facility] 5:24 PM 07/18/16

## 2016-07-18 NOTE — Assessment & Plan Note (Signed)
She is taking flonase, can continue this daily.  Can take claritin if not resolved with this.

## 2016-07-19 NOTE — Progress Notes (Signed)
Note reviewed, and agree with documentation and plan. Signed,   Meredith StaggersJeffrey Rocio Wolak, MD [facility].  07/19/16 1:09 PM

## 2017-03-22 ENCOUNTER — Emergency Department (HOSPITAL_BASED_OUTPATIENT_CLINIC_OR_DEPARTMENT_OTHER)
Admission: EM | Admit: 2017-03-22 | Discharge: 2017-03-22 | Disposition: A | Discharge status: Home / Self Care | Payer: No Typology Code available for payment source | Attending: Emergency Medicine | Admitting: Emergency Medicine

## 2017-03-22 ENCOUNTER — Encounter (HOSPITAL_BASED_OUTPATIENT_CLINIC_OR_DEPARTMENT_OTHER): Payer: Self-pay

## 2017-03-22 DIAGNOSIS — Y9389 Activity, other specified: Secondary | ICD-10-CM | POA: Diagnosis not present

## 2017-03-22 DIAGNOSIS — Y998 Other external cause status: Secondary | ICD-10-CM | POA: Diagnosis not present

## 2017-03-22 DIAGNOSIS — M25512 Pain in left shoulder: Secondary | ICD-10-CM | POA: Insufficient documentation

## 2017-03-22 DIAGNOSIS — Y92410 Unspecified street and highway as the place of occurrence of the external cause: Secondary | ICD-10-CM | POA: Insufficient documentation

## 2017-03-22 DIAGNOSIS — Z87891 Personal history of nicotine dependence: Secondary | ICD-10-CM | POA: Insufficient documentation

## 2017-03-22 DIAGNOSIS — M545 Low back pain: Secondary | ICD-10-CM | POA: Diagnosis not present

## 2017-03-22 MED ORDER — IBUPROFEN 600 MG PO TABS: 600.0000 mg | ORAL_TABLET | Freq: Four times a day (QID) | ORAL | 0 refills | Status: AC | PRN

## 2017-03-22 MED ORDER — CYCLOBENZAPRINE HCL 10 MG PO TABS: 10.0000 mg | ORAL_TABLET | Freq: Two times a day (BID) | ORAL | 0 refills | Status: AC | PRN
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
## 2017-12-19 ENCOUNTER — Emergency Department (HOSPITAL_BASED_OUTPATIENT_CLINIC_OR_DEPARTMENT_OTHER): Payer: BLUE CROSS/BLUE SHIELD

## 2017-12-19 ENCOUNTER — Encounter (HOSPITAL_BASED_OUTPATIENT_CLINIC_OR_DEPARTMENT_OTHER): Payer: Self-pay | Admitting: Emergency Medicine

## 2017-12-19 ENCOUNTER — Emergency Department (HOSPITAL_BASED_OUTPATIENT_CLINIC_OR_DEPARTMENT_OTHER)
Admission: EM | Admit: 2017-12-19 | Discharge: 2017-12-19 | Disposition: A | Discharge status: Home / Self Care | Payer: BLUE CROSS/BLUE SHIELD | Attending: Emergency Medicine | Admitting: Emergency Medicine

## 2017-12-19 ENCOUNTER — Other Ambulatory Visit: Payer: Self-pay

## 2017-12-19 DIAGNOSIS — R079 Chest pain, unspecified: Secondary | ICD-10-CM | POA: Diagnosis not present

## 2017-12-19 DIAGNOSIS — Z79899 Other long term (current) drug therapy: Secondary | ICD-10-CM | POA: Diagnosis not present

## 2017-12-19 DIAGNOSIS — Z87891 Personal history of nicotine dependence: Secondary | ICD-10-CM | POA: Diagnosis not present

## 2017-12-19 LAB — BASIC METABOLIC PANEL
ANION GAP: 9 (ref 5–15)
BUN: 13 mg/dL (ref 6–20)
CALCIUM: 9.7 mg/dL (ref 8.9–10.3)
CHLORIDE: 101 mmol/L (ref 101–111)
CO2: 27 mmol/L (ref 22–32)
CREATININE: 0.75 mg/dL (ref 0.44–1.00)
GFR calc non Af Amer: >60 mL/min (ref >60)
Glucose, Bld: 94 mg/dL (ref 65–99)
Potassium: 4.1 mmol/L (ref 3.5–5.1)
SODIUM: 137 mmol/L (ref 135–145)

## 2017-12-19 LAB — CBC WITH DIFFERENTIAL/PLATELET
BASOS ABS: 0 10*3/uL (ref 0.0–0.1)
BASOS PCT: 0 %
EOS ABS: 0.1 10*3/uL (ref 0.0–0.7)
Eosinophils Relative: 1 %
HCT: 42.8 % (ref 36.0–46.0)
HEMOGLOBIN: 15.2 g/dL — AB (ref 12.0–15.0)
Lymphocytes Relative: 33 %
Lymphs Abs: 2.7 10*3/uL (ref 0.7–4.0)
MCH: 29.9 pg (ref 26.0–34.0)
MCHC: 35.5 g/dL (ref 30.0–36.0)
MCV: 84.1 fL (ref 78.0–100.0)
Monocytes Absolute: 0.6 10*3/uL (ref 0.1–1.0)
Monocytes Relative: 7 %
Neutro Abs: 5 10*3/uL (ref 1.7–7.7)
Neutrophils Relative %: 59 %
Platelets: 284 10*3/uL (ref 150–400)
RBC: 5.09 MIL/uL (ref 3.87–5.11)
RDW: 12.9 % (ref 11.5–15.5)
WBC: 8.3 10*3/uL (ref 4.0–10.5)

## 2017-12-19 LAB — TROPONIN I: Troponin I: <0.03 ng/mL (ref <0.03)

## 2017-12-19 NOTE — Discharge Instructions (Signed)
It was my pleasure taking care of you today!  ° °You were seen in the Emergency Department today for chest pain.  As we have discussed, today’s blood work and imaging are normal, but you may require further testing. ° °Please call your primary care physician to schedule a follow up appointment to discuss your ER visit today.  ° °Return to the Emergency Department if you experience any further chest pain/pressure/tightness, difficulty breathing, sudden sweating, or other symptoms that concern you. °

## 2017-12-19 NOTE — ED Provider Notes (Signed)
[facility] EMERGENCY DEPARTMENT Provider Note   CSN: [id number] Arrival date & time: 12/19/17  4098     History   Chief Complaint Chief Complaint  Patient presents with  . Chest Pain    HPI Kristie Clark is a 37 y.o. female.  The history is provided by the patient and medical records. No language interpreter was used.  Chest Pain     Kristie Clark is a 37 y.o. female  with a PMH of as listed below who presents to the Emergency Department complaining of central chest pain that radiates down her left shoulder.  Pain began yesterday around 3:30 PM.  Pain has been constant and is described as sharp.  She did take 2 or 3 baby aspirin, but does not feel as if that provided any relief.  Denies associated nausea, vomiting, diaphoresis, shortness of breath, abdominal pain or back pain.  Denies history of similar.  Pain worse with certain movements (mostly of the left shoulder) or palpation. + smoker. Reports being told she was borderline for diabetes and to watch her sugars.  Denies history of hypertension or hyperlipidemia.  No history of heart disease.  Father has had a prior MI in his 79s.  Denies recent surgeries, immobilizations or long travel.  No leg swelling or calf pain.  Not on oral contraceptives or hormones.  No fever, chills, cough, congestion.  Denies sick contacts.   Past Medical History:  Diagnosis Date  . Abnormal Pap smear   . Gestational diabetes    on glyburide  . Headache(784.0)   . History of chicken pox   . No pertinent past medical history     Patient Active Problem List   Diagnosis Date Noted  . Thrush 07/18/2016  . Acute seasonal allergic rhinitis due to pollen 07/18/2016    Past Surgical History:  Procedure Laterality Date  . ABDOMINAL HYSTERECTOMY    . NO PAST SURGERIES      OB History    Gravida Para Term Preterm AB Living   1 1 1  0 0 1   SAB TAB Ectopic Multiple Live Births   0 0 0 0 1       Home Medications    Prior to  Admission medications   Medication Sig Start Date End Date Taking? Authorizing Provider  buPROPion (WELLBUTRIN XL) 150 MG 24 hr tablet Take 150 mg by mouth daily.   Yes [provider]  amphetamine-dextroamphetamine (ADDERALL XR) 10 MG 24 hr capsule Take 10 mg by mouth daily.    [provider]  cyclobenzaprine (FLEXERIL) 10 MG tablet Take 1 tablet (10 mg total) by mouth 2 (two) times daily as needed for muscle spasms. 03/22/17   Fayrene Helper, PA-C  ibuprofen (ADVIL,MOTRIN) 600 MG tablet Take 1 tablet (600 mg total) by mouth every 6 (six) hours as needed. 03/22/17   Fayrene Helper, PA-C  Polyethylene Glycol 3350 (MIRALAX PO) Take by mouth.    [provider]    Family History Family History  Problem Relation Age of Onset  . Hypertension Mother   . Heart attack Father   . Hypertension Father   . Mental retardation Brother        Down's    Social History Social History   Tobacco Use  . Smoking status: Former Smoker    Last attempt to quit: 09/07/2011    Years since quitting: 6.2  . Smokeless tobacco: Never Used  Substance Use Topics  . Alcohol use: No  . Drug  use: No     Allergies   Patient has no known allergies.   Review of Systems Review of Systems  Cardiovascular: Positive for chest pain.  All other systems reviewed and are negative.    Physical Exam Updated Vital Signs BP 110/80   Pulse 75   Temp 98 F (36.7 C) (Oral)   Resp (!) 28   Ht 5' (1.524 m)   Wt 58.5 kg (129 lb)   LMP 01/28/2014   SpO2 94%   BMI 25.19 kg/m   Physical Exam  Constitutional: She is oriented to person, place, and time. She appears well-developed and well-nourished. No distress.  HENT:  Head: Normocephalic and atraumatic.  Cardiovascular: Normal rate, regular rhythm and normal heart sounds.  No murmur heard. Pulmonary/Chest: Effort normal and breath sounds normal. No respiratory distress. She has no wheezes. She has no rales.  Tenderness to left-sided chest  wall.  Abdominal: Soft. She exhibits no distension. There is no tenderness.  Musculoskeletal:  No lower extremity edema or calf tenderness.  Neurological: She is alert and oriented to person, place, and time.  Skin: Skin is warm and dry.  Nursing note and vitals reviewed.    ED Treatments / Results  Labs (all labs ordered are listed, but only abnormal results are displayed) Labs Reviewed  CBC WITH DIFFERENTIAL/PLATELET - Abnormal; Notable for the following components:      Result Value   Hemoglobin 15.2 (*)    All other components within normal limits  BASIC METABOLIC PANEL  TROPONIN I    EKG  EKG Interpretation  Date/Time:  Wednesday December 19 2017 10:11:25 EST Ventricular Rate:  83 PR Interval:    QRS Duration: 80 QT Interval:  351 QTC Calculation: 413 R Axis:   55 Text Interpretation:  Sinus rhythm Confirmed by Raeford RazorKohut, Stephen [phone]([id number]) on 12/19/2017 10:49:11 AM       Radiology Dg Chest 2 View  Result Date: 12/19/2017 CLINICAL DATA:  Mid chest pain radiating into the left shoulder since yesterday. Nonsmoker. EXAM: CHEST - 2 VIEW COMPARISON:  Chest x-ray of June 18, 2015 FINDINGS: The lungs are adequately inflated. There is no focal infiltrate. There is no pleural effusion. The heart and pulmonary vascularity are normal. The trachea is midline. The bony thorax exhibits no acute abnormality. IMPRESSION: There is no active cardiopulmonary disease. Electronically Signed   By: David  Swazil[place]Jordan M.D.   On: 12/19/2017 10:20    Procedures Procedures (including critical care time)  Medications Ordered in ED Medications - No data to display   Initial Impression / Assessment and Plan / ED Course  I have reviewed the triage vital signs and the nursing notes.  Pertinent labs & imaging results that were available during my care of the patient were reviewed by me and considered in my medical decision making (see chart for details).    Kristie Clark is a 37 y.o. female who  presents to ED for chest pain which began yesterday. On exam, patient is afebrile, hemodynamically stable. Pain is reproducible with palpation of left chest wall as well as movement of the left shoulder.    Labs reviewed and reassuring with negative troponin.  CXR with no acute abnormalities.  EKG NSR.  Heart score of 1. PERC negative  Patient's symptoms unlikely to be of cardiac etiology. Labs and imaging reviewed again prior to discharge. Patient has been advised to return to the ED if development of any exertional chest pain, trouble breathing, new/worsening symptoms or for any additional concerns.  Evaluation does not show pathology that would require ongoing emergent intervention or inpatient treatment. Encouraged to follow up with PCP. Patient understands return precautions and follow up plan. All questions answered.   Final Clinical Impressions(s) / ED Diagnoses   Final diagnoses:  Chest pain with low risk for cardiac etiology    ED Discharge Orders    None       Ward, Chase Picket, PA-C 12/19/17 1205    Raeford Razor, MD 12/19/17 1453

## 2017-12-19 NOTE — ED Triage Notes (Signed)
Patient states that she is having pain to her central chest and to her left breast and down her left shoulder

## 2018-06-21 ENCOUNTER — Encounter (HOSPITAL_BASED_OUTPATIENT_CLINIC_OR_DEPARTMENT_OTHER): Payer: Self-pay | Admitting: *Deleted

## 2018-06-21 ENCOUNTER — Emergency Department (HOSPITAL_BASED_OUTPATIENT_CLINIC_OR_DEPARTMENT_OTHER): Payer: BLUE CROSS/BLUE SHIELD

## 2018-06-21 ENCOUNTER — Other Ambulatory Visit: Payer: Self-pay

## 2018-06-21 ENCOUNTER — Emergency Department (HOSPITAL_BASED_OUTPATIENT_CLINIC_OR_DEPARTMENT_OTHER)
Admission: EM | Admit: 2018-06-21 | Discharge: 2018-06-21 | Disposition: A | Discharge status: Home / Self Care | Payer: BLUE CROSS/BLUE SHIELD | Attending: Emergency Medicine | Admitting: Emergency Medicine

## 2018-06-21 DIAGNOSIS — Z87891 Personal history of nicotine dependence: Secondary | ICD-10-CM | POA: Diagnosis not present

## 2018-06-21 DIAGNOSIS — M545 Low back pain: Secondary | ICD-10-CM | POA: Insufficient documentation

## 2018-06-21 DIAGNOSIS — T148XXA Other injury of unspecified body region, initial encounter: Secondary | ICD-10-CM

## 2018-06-21 DIAGNOSIS — Z79899 Other long term (current) drug therapy: Secondary | ICD-10-CM | POA: Diagnosis not present

## 2018-06-21 MED ORDER — ACETAMINOPHEN 500 MG PO TABS
1000.0000 mg | ORAL_TABLET | Freq: Once | ORAL | Status: AC
  Administered 2018-06-21: 1000 mg via ORAL
  Filled 2018-06-21: qty 2

## 2018-06-21 MED ORDER — METHOCARBAMOL 500 MG PO TABS: 500.0000 mg | ORAL_TABLET | Freq: Two times a day (BID) | ORAL | 0 refills | Status: AC
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
## 2018-06-21 NOTE — ED Notes (Signed)
ED Provider at bedside. 

## 2018-06-21 NOTE — ED Notes (Signed)
No skin tears or scratches.  No bruises noted.
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
